# Patient Record
Sex: Male | Born: 2000 | Race: Black or African American | Hispanic: No | Marital: Single | Smoking: Never smoker
Health system: Southern US, Community
[De-identification: ages and names within clinical notes are randomized; demographics above are authoritative.]

## PROBLEM LIST (undated history)

## (undated) DIAGNOSIS — R109 Unspecified abdominal pain: Secondary | ICD-10-CM

## (undated) HISTORY — DX: Unspecified abdominal pain: R10.9

---
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
## 2022-07-06 ENCOUNTER — Emergency Department (HOSPITAL_COMMUNITY): Payer: Medicaid Other

## 2022-07-06 ENCOUNTER — Emergency Department (HOSPITAL_COMMUNITY)
Admission: EM | Admit: 2022-07-06 | Discharge: 2022-07-06 | Discharge status: Against Medical Advice | Payer: Medicaid Other | Attending: Physician Assistant | Admitting: Physician Assistant

## 2022-07-06 ENCOUNTER — Encounter (HOSPITAL_COMMUNITY): Payer: Self-pay

## 2022-07-06 ENCOUNTER — Other Ambulatory Visit: Payer: Self-pay

## 2022-07-06 DIAGNOSIS — R079 Chest pain, unspecified: Secondary | ICD-10-CM | POA: Diagnosis not present

## 2022-07-06 DIAGNOSIS — Z5321 Procedure and treatment not carried out due to patient leaving prior to being seen by health care provider: Secondary | ICD-10-CM | POA: Insufficient documentation

## 2022-07-06 LAB — BASIC METABOLIC PANEL
Anion gap: 8 (ref 5–15)
BUN: <5 mg/dL — ABNORMAL LOW (ref 6–20)
CO2: 28 mmol/L (ref 22–32)
Calcium: 9.4 mg/dL (ref 8.9–10.3)
Chloride: 102 mmol/L (ref 98–111)
Creatinine, Ser: 0.95 mg/dL (ref 0.61–1.24)
GFR, Estimated: >60 mL/min (ref >60)
Glucose, Bld: 68 mg/dL — ABNORMAL LOW (ref 70–99)
Potassium: 3.4 mmol/L — ABNORMAL LOW (ref 3.5–5.1)
Sodium: 138 mmol/L (ref 135–145)

## 2022-07-06 LAB — CBC
HCT: 40.4 % (ref 39.0–52.0)
Hemoglobin: 13.1 g/dL (ref 13.0–17.0)
MCH: 27.7 pg (ref 26.0–34.0)
MCHC: 32.4 g/dL (ref 30.0–36.0)
MCV: 85.4 fL (ref 80.0–100.0)
Platelets: 277 10*3/uL (ref 150–400)
RBC: 4.73 MIL/uL (ref 4.22–5.81)
RDW: 14.2 % (ref 11.5–15.5)
WBC: 6.4 10*3/uL (ref 4.0–10.5)
nRBC: 0 % (ref 0.0–0.2)

## 2022-07-06 LAB — TROPONIN I (HIGH SENSITIVITY): Troponin I (High Sensitivity): 5 ng/L (ref <18)

## 2022-07-06 NOTE — ED Notes (Signed)
Pt requesting for IV to be removed. IV removed by tech.

## 2022-07-06 NOTE — ED Triage Notes (Addendum)
Pt arrived via [facility] from home for c/o right sided chest pain that radiates to right arm. The pain hurts worse with inspiration. Pt states he just started working out again. EMS gave ASA 324mg .

## 2022-07-06 NOTE — ED Provider Triage Note (Signed)
Emergency Medicine Provider Triage Evaluation Note  Larry Solomon , a 22 y.o. male  was evaluated in triage.  Pt complains of chest pain which began approximately 1 hour ago, describing as a right-sided aching without any radiation.  No alleviating or exacerbating factors, has been working out recently.  No prior history of CAD, no family history of CAD, no tobacco use.  Review of Systems  Positive: Chest pain Negative: Sob, cough  Physical Exam  BP 127/78 (BP Location: Right Arm)   Pulse 81   Temp 99.1 F (37.3 C)   Resp 14   Ht 5\' 10"  (1.778 m)   Wt 68 kg   SpO2 99%   BMI 21.52 kg/m  Gen:   Awake, no distress   Resp:  Normal effort  MSK:   Moves extremities without difficulty  Other:    Medical Decision Making  Medically screening exam initiated at 11:38 AM.  Appropriate orders placed.  [name] was informed that the remainder of the evaluation will be completed by another provider, this initial triage assessment does not replace that evaluation, and the importance of remaining in the ED until their evaluation is complete.     [name], PA-C 07/06/22 1140

## 2023-11-16 ENCOUNTER — Ambulatory Visit
Admission: EM | Admit: 2023-11-16 | Discharge: 2023-11-16 | Disposition: A | Discharge status: Home / Self Care | Attending: Unknown Physician Specialty | Admitting: Unknown Physician Specialty

## 2023-11-16 ENCOUNTER — Other Ambulatory Visit: Payer: Self-pay

## 2023-11-16 DIAGNOSIS — M545 Low back pain, unspecified: Secondary | ICD-10-CM

## 2023-11-16 MED ORDER — CYCLOBENZAPRINE HCL 10 MG PO TABS: 10.0000 mg | ORAL_TABLET | Freq: Two times a day (BID) | ORAL | 0 refills | Status: AC | PRN

## 2023-11-16 MED ORDER — KETOROLAC TROMETHAMINE 30 MG/ML IJ SOLN
30.0000 mg | Freq: Once | INTRAMUSCULAR | Status: AC
  Administered 2023-11-16: 30 mg via INTRAMUSCULAR

## 2023-11-16 NOTE — Discharge Instructions (Addendum)
 You were given a Toradol injection in clinic today. Do not take any over the counter NSAID's such as Advil, ibuprofen, Aleve, or naproxen for 24 hours. You may take tylenol  if needed.  May start Flexeril twice daily as needed.  Please note this medication will make you drowsy.  Do not drink alcohol or drive while on this medication.  You may do heat and rest.  Please follow-up with your PCP if your symptoms do not improve.  Please go to the ER for any worsening symptoms.  Hope you feel better soon!

## 2023-11-16 NOTE — ED Provider Notes (Signed)
 [facility]    CSN: [id number] Arrival date & time: 11/16/23  1005      History   Chief Complaint No chief complaint on file.   HPI Larry Solomon is a 23 y.o. male presents for back pain.  Patient reports 2 to 3 weeks of an intermittent nonradiating bilateral lower back pain.  States it is a spasming type throbbing pain.  He denies any numbness/tingling/weakness of his lower extremities, no bowel or bladder incontinence, no saddle paresthesia.  No known injury or known inciting event.  He has not taken any OTC treatments for the symptoms.  No other concerns at this time.  HPI  Past Medical History:  Diagnosis Date   Abdominal pain    Asthma     Patient Active Problem List   Diagnosis Date Noted   Abdominal pain     History reviewed. No pertinent surgical history.     Home Medications    Prior to Admission medications   Medication Sig Start Date End Date Taking? Authorizing Provider  cyclobenzaprine (FLEXERIL) 10 MG tablet Take 1 tablet (10 mg total) by mouth 2 (two) times daily as needed for muscle spasms. 11/16/23  Yes Shavelle Runkel, Jodi R, NP  ondansetron  (ZOFRAN  ODT) 4 MG disintegrating tablet Take 1 tablet (4 mg total) by mouth every 8 (eight) hours as needed for nausea or vomiting. 11/03/13   Carleton Cheek, PA-C  ranitidine (ZANTAC) 150 MG tablet Take 150 mg by mouth 2 (two) times daily.    [provider]  omeprazole  (PRILOSEC) 20 MG capsule Take 1 capsule (20 mg total) by mouth daily. 11/03/13 11/03/13  Carleton Cheek, PA-C    Family History History reviewed. No pertinent family history.  Social History Social History   Tobacco Use   Smoking status: Never   Smokeless tobacco: Never  Substance Use Topics   Alcohol use: No   Drug use: Yes    Types: Marijuana     Allergies   Patient has no known allergies.   Review of Systems Review of Systems  Musculoskeletal:  Positive for back pain.     Physical Exam Triage Vital Signs ED Triage Vitals   Encounter Vitals Group     BP 11/16/23 1013 118/76     Systolic BP Percentile --      Diastolic BP Percentile --      Pulse Rate 11/16/23 1013 80     Resp 11/16/23 1013 16     Temp 11/16/23 1013 98.7 F (37.1 C)     Temp Source 11/16/23 1013 Oral     SpO2 11/16/23 1013 97 %     Weight --      Height --      Head Circumference --      Peak Flow --      Pain Score 11/16/23 1011 7     Pain Loc --      Pain Education --      Exclude from Growth Chart --    No data found.  Updated Vital Signs BP 118/76   Pulse 80   Temp 98.7 F (37.1 C) (Oral)   Resp 16   SpO2 97%   Visual Acuity Right Eye Distance:   Left Eye Distance:   Bilateral Distance:    Right Eye Near:   Left Eye Near:    Bilateral Near:     Physical Exam Vitals and nursing note reviewed.  Constitutional:      General: He is not in acute  distress.    Appearance: Normal appearance. He is not ill-appearing.  HENT:     Head: Normocephalic and atraumatic.  Eyes:     Pupils: Pupils are equal, round, and reactive to light.  Cardiovascular:     Rate and Rhythm: Normal rate.  Pulmonary:     Effort: Pulmonary effort is normal.  Musculoskeletal:     Lumbar back: Spasms and tenderness present. No swelling, edema, deformity, signs of trauma, lacerations or bony tenderness. Normal range of motion. Negative right straight leg raise test and negative left straight leg raise test. No scoliosis.       Back:     Comments: Strength is 5 out of 5 bilateral lower extremities  Skin:    General: Skin is warm and dry.  Neurological:     General: No focal deficit present.     Mental Status: He is alert and oriented to person, place, and time.  Psychiatric:        Mood and Affect: Mood normal.        Behavior: Behavior normal.      UC Treatments / Results  Labs (all labs ordered are listed, but only abnormal results are displayed) Labs Reviewed - No data to display  Basic metabolic panel Order: [id number]  Status:  Final result     Next appt: None   Test Result Released: No (inaccessible in MyChart)   0 Result Notes     Component Ref Range & Units (hover) 1 yr ago 13 yr ago  Sodium 138 135 R  Potassium 3.4 Low  3.4 Low  R  Chloride 102 105 R  CO2 28 25 R  Glucose, Bld 68 Low  116 High   Comment: Glucose reference range applies only to samples taken after fasting for at least 8 hours.  BUN <5 Low  15 R  Creatinine, Ser 0.95 0.49 R  Calcium 9.4 9.1 R  GFR, Estimated >60   Comment: (NOTE) Calculated using the CKD-EPI Creatinine Equation (2021)  Anion gap 8   Comment: Performed at [facility], [address], [place], [place] [address]  Resulting Agency [facility]        Specimen Collected: 07/06/22 12:39 Last Resulted: 07/06/22 15:31       EKG   Radiology No results found.  Procedures Procedures (including critical care time)  Medications Ordered in UC Medications  ketorolac (TORADOL) 30 MG/ML injection 30 mg (30 mg Intramuscular Given 11/16/23 1044)    Initial Impression / Assessment and Plan / UC Course  I have reviewed the triage vital signs and the nursing notes.  Pertinent labs & imaging results that were available during my care of the patient were reviewed by me and considered in my medical decision making (see chart for details).     Reviewed exam and symptoms with patient.  No red flags.  Discussed low back strain/muscle spasms.  Patient given Toradol injection in clinic.  Monitored for 10 minutes after injection with no reaction noted and tolerated well.  He was instructed no NSAIDs for 24 hours and verbalized understanding.  Will do trial of Flexeril, side effect profile reviewed.  Discussed heat, rest, and PCP follow-up in 2 to 3 days for recheck.  ER precautions reviewed and patient verbalized understanding. Final Clinical Impressions(s) / UC Diagnoses   Final diagnoses:  Acute bilateral low back pain without sciatica     Discharge  Instructions      You were given a  Toradol injection in clinic today. Do not take any over the counter NSAID's such as Advil, ibuprofen, Aleve, or naproxen for 24 hours. You may take tylenol  if needed.  May start Flexeril twice daily as needed.  Please note this medication will make you drowsy.  Do not drink alcohol or drive while on this medication.  You may do heat and rest.  Please follow-up with your PCP if your symptoms do not improve.  Please go to the ER for any worsening symptoms.  Hope you feel better soon!    ED Prescriptions     Medication Sig Dispense Auth. Provider   cyclobenzaprine (FLEXERIL) 10 MG tablet Take 1 tablet (10 mg total) by mouth 2 (two) times daily as needed for muscle spasms. 10 tablet Fotini Lemus, Jodi R, NP      PDMP not reviewed this encounter.   Alleen Arbour, NP 11/16/23 1104

## 2023-11-16 NOTE — ED Triage Notes (Signed)
 Pt c/o lower back painx2-3wks. Pt denies injury. Pt denies numbness or tingling. Pt denies loss of bowel or bladder. Pt walked well to exam room
